# Patient Record
Sex: Female | Born: 1980 | Race: White | Hispanic: No | Marital: Married | Smoking: Current every day smoker
Health system: Southern US, Community
[De-identification: ages and names within clinical notes are randomized; demographics above are authoritative.]

## PROBLEM LIST (undated history)

## (undated) DIAGNOSIS — E282 Polycystic ovarian syndrome: Secondary | ICD-10-CM

## (undated) DIAGNOSIS — N281 Cyst of kidney, acquired: Secondary | ICD-10-CM

## (undated) DIAGNOSIS — E119 Type 2 diabetes mellitus without complications: Secondary | ICD-10-CM

## (undated) HISTORY — PX: CARPAL TUNNEL RELEASE: SHX101

---

## 2014-12-04 ENCOUNTER — Other Ambulatory Visit: Payer: Self-pay

## 2014-12-04 ENCOUNTER — Encounter: Payer: Self-pay | Admitting: *Deleted

## 2014-12-04 ENCOUNTER — Emergency Department
Admission: EM | Admit: 2014-12-04 | Discharge: 2014-12-05 | Disposition: A | Discharge status: Home / Self Care | Payer: TRICARE For Life (TFL) | Attending: Emergency Medicine | Admitting: Emergency Medicine

## 2014-12-04 DIAGNOSIS — R1012 Left upper quadrant pain: Secondary | ICD-10-CM

## 2014-12-04 DIAGNOSIS — E860 Dehydration: Secondary | ICD-10-CM | POA: Insufficient documentation

## 2014-12-04 DIAGNOSIS — R197 Diarrhea, unspecified: Secondary | ICD-10-CM | POA: Insufficient documentation

## 2014-12-04 DIAGNOSIS — Z3202 Encounter for pregnancy test, result negative: Secondary | ICD-10-CM | POA: Insufficient documentation

## 2014-12-04 DIAGNOSIS — Z79899 Other long term (current) drug therapy: Secondary | ICD-10-CM | POA: Diagnosis not present

## 2014-12-04 DIAGNOSIS — E119 Type 2 diabetes mellitus without complications: Secondary | ICD-10-CM | POA: Insufficient documentation

## 2014-12-04 DIAGNOSIS — Z720 Tobacco use: Secondary | ICD-10-CM | POA: Insufficient documentation

## 2014-12-04 DIAGNOSIS — R112 Nausea with vomiting, unspecified: Secondary | ICD-10-CM | POA: Insufficient documentation

## 2014-12-04 DIAGNOSIS — R55 Syncope and collapse: Secondary | ICD-10-CM | POA: Insufficient documentation

## 2014-12-04 HISTORY — DX: Polycystic ovarian syndrome: E28.2

## 2014-12-04 HISTORY — DX: Type 2 diabetes mellitus without complications: E11.9

## 2014-12-04 HISTORY — DX: Cyst of kidney, acquired: N28.1

## 2014-12-04 LAB — CBC WITH DIFFERENTIAL/PLATELET
BASOS ABS: 0.1 10*3/uL (ref 0–0.1)
Basophils Relative: 1 %
Eosinophils Absolute: 0.4 10*3/uL (ref 0–0.7)
Eosinophils Relative: 2 %
HEMATOCRIT: 54.7 % — AB (ref 35.0–47.0)
Hemoglobin: 18.3 g/dL — ABNORMAL HIGH (ref 12.0–16.0)
Lymphocytes Relative: 7 %
Lymphs Abs: 1.3 10*3/uL (ref 1.0–3.6)
MCH: 30.5 pg (ref 26.0–34.0)
MCHC: 33.5 g/dL (ref 32.0–36.0)
MCV: 91 fL (ref 80.0–100.0)
Monocytes Absolute: 1.4 10*3/uL — ABNORMAL HIGH (ref 0.2–0.9)
Monocytes Relative: 8 %
Neutro Abs: 15.6 10*3/uL — ABNORMAL HIGH (ref 1.4–6.5)
Neutrophils Relative %: 82 %
Platelets: 240 10*3/uL (ref 150–440)
RBC: 6.01 MIL/uL — ABNORMAL HIGH (ref 3.80–5.20)
RDW: 13.4 % (ref 11.5–14.5)
WBC: 18.8 10*3/uL — ABNORMAL HIGH (ref 3.6–11.0)

## 2014-12-04 LAB — COMPREHENSIVE METABOLIC PANEL
ALBUMIN: 4.4 g/dL (ref 3.5–5.0)
ALT: 36 U/L (ref 14–54)
AST: 28 U/L (ref 15–41)
Alkaline Phosphatase: 66 U/L (ref 38–126)
Anion gap: 9 (ref 5–15)
BUN: 19 mg/dL (ref 6–20)
CHLORIDE: 103 mmol/L (ref 101–111)
CO2: 29 mmol/L (ref 22–32)
CREATININE: 0.94 mg/dL (ref 0.44–1.00)
Calcium: 9.3 mg/dL (ref 8.9–10.3)
GFR calc Af Amer: >60 mL/min (ref >60)
GFR calc non Af Amer: >60 mL/min (ref >60)
Glucose, Bld: 172 mg/dL — ABNORMAL HIGH (ref 65–99)
Potassium: 4.9 mmol/L (ref 3.5–5.1)
Sodium: 141 mmol/L (ref 135–145)
TOTAL PROTEIN: 7.8 g/dL (ref 6.5–8.1)
Total Bilirubin: 1 mg/dL (ref 0.3–1.2)

## 2014-12-04 LAB — LIPASE, BLOOD: Lipase: 49 U/L (ref 22–51)

## 2014-12-04 NOTE — ED Notes (Signed)
Pt to triage via wheelchair.  Pt brought in via ems from home.  Pt has abd pain with nausea and vomiting.  Pt has lower back pain.  Denies urinary sx.

## 2014-12-04 NOTE — ED Notes (Signed)
Pt unable to void at this time. 

## 2014-12-05 ENCOUNTER — Encounter: Payer: Self-pay | Admitting: Emergency Medicine

## 2014-12-05 LAB — URINALYSIS COMPLETE WITH MICROSCOPIC (ARMC ONLY)
Bilirubin Urine: NEGATIVE
GLUCOSE, UA: NEGATIVE mg/dL
HGB URINE DIPSTICK: NEGATIVE
KETONES UR: NEGATIVE mg/dL
LEUKOCYTES UA: NEGATIVE
Nitrite: NEGATIVE
Protein, ur: NEGATIVE mg/dL
SPECIFIC GRAVITY, URINE: 1.029 (ref 1.005–1.030)
pH: 5 (ref 5.0–8.0)

## 2014-12-05 LAB — PREGNANCY, URINE: PREG TEST UR: NEGATIVE

## 2014-12-05 LAB — TROPONIN I: Troponin I: <0.03 ng/mL (ref <0.031)

## 2014-12-05 MED ORDER — GI COCKTAIL ~~LOC~~
30.0000 mL | Freq: Once | ORAL | Status: AC
  Administered 2014-12-05: 30 mL via ORAL
  Filled 2014-12-05: qty 30

## 2014-12-05 MED ORDER — METOCLOPRAMIDE HCL 5 MG/ML IJ SOLN
10.0000 mg | Freq: Once | INTRAMUSCULAR | Status: AC
  Administered 2014-12-05: 10 mg via INTRAVENOUS
  Filled 2014-12-05: qty 2

## 2014-12-05 MED ORDER — METOCLOPRAMIDE HCL 10 MG PO TABS: 10.0000 mg | ORAL_TABLET | Freq: Three times a day (TID) | ORAL | Status: AC

## 2014-12-05 MED ORDER — SODIUM CHLORIDE 0.9 % IV BOLUS (SEPSIS)
1000.0000 mL | Freq: Once | INTRAVENOUS | Status: AC
  Administered 2014-12-05: 1000 mL via INTRAVENOUS

## 2014-12-05 NOTE — Discharge Instructions (Signed)
Dehydration, Adult Dehydration is when you lose more fluids from the body than you take in. Vital organs like the kidneys, brain, and heart cannot function without a proper amount of fluids and salt. Any loss of fluids from the body can cause dehydration.  CAUSES   Vomiting.  Diarrhea.  Excessive sweating.  Excessive urine output.  Fever. SYMPTOMS  Mild dehydration  Thirst.  Dry lips.  Slightly dry mouth. Moderate dehydration  Very dry mouth.  Sunken eyes.  Skin does not bounce back quickly when lightly pinched and released.  Dark urine and decreased urine production.  Decreased tear production.  Headache. Severe dehydration  Very dry mouth.  Extreme thirst.  Rapid, weak pulse (more than 100 beats per minute at rest).  Cold hands and feet.  Not able to sweat in spite of heat and temperature.  Rapid breathing.  Blue lips.  Confusion and lethargy.  Difficulty being awakened.  Minimal urine production.  No tears. DIAGNOSIS  Your caregiver will diagnose dehydration based on your symptoms and your exam. Blood and urine tests will help confirm the diagnosis. The diagnostic evaluation should also identify the cause of dehydration. TREATMENT  Treatment of mild or moderate dehydration can often be done at home by increasing the amount of fluids that you drink. It is best to drink small amounts of fluid more often. Drinking too much at one time can make vomiting worse. Refer to the home care instructions below. Severe dehydration needs to be treated at the hospital where you will probably be given intravenous (IV) fluids that contain water and electrolytes. HOME CARE INSTRUCTIONS   Ask your caregiver about specific rehydration instructions.  Drink enough fluids to keep your urine clear or pale yellow.  Drink small amounts frequently if you have nausea and vomiting.  Eat as you normally do.  Avoid:  Foods or drinks high in sugar.  Carbonated  drinks.  Juice.  Extremely hot or cold fluids.  Drinks with caffeine.  Fatty, greasy foods.  Alcohol.  Tobacco.  Overeating.  Gelatin desserts.  Wash your hands well to avoid spreading bacteria and viruses.  Only take over-the-counter or prescription medicines for pain, discomfort, or fever as directed by your caregiver.  Ask your caregiver if you should continue all prescribed and over-the-counter medicines.  Keep all follow-up appointments with your caregiver. SEEK MEDICAL CARE IF:  You have abdominal pain and it increases or stays in one area (localizes).  You have a rash, stiff neck, or severe headache.  You are irritable, sleepy, or difficult to awaken.  You are weak, dizzy, or extremely thirsty. SEEK IMMEDIATE MEDICAL CARE IF:   You are unable to keep fluids down or you get worse despite treatment.  You have frequent episodes of vomiting or diarrhea.  You have blood or green matter (bile) in your vomit.  You have blood in your stool or your stool looks black and tarry.  You have not urinated in 6 to 8 hours, or you have only urinated a small amount of very dark urine.  You have a fever.  You faint. MAKE SURE YOU:   Understand these instructions.  Will watch your condition.  Will get help right away if you are not doing well or get worse. Document Released: 05/11/2005 Document Revised: 08/03/2011 Document Reviewed: 12/29/2010 [facility] Patient Information 2015 [place], [place]. This information is not intended to replace advice given to you by your health care provider. Make sure you discuss any questions you have with your health care  provider.  Nausea and Vomiting Nausea is a sick feeling that often comes before throwing up (vomiting). Vomiting is a reflex where stomach contents come out of your mouth. Vomiting can cause severe loss of body fluids (dehydration). Children and elderly adults can become dehydrated quickly, especially if they also have  diarrhea. Nausea and vomiting are symptoms of a condition or disease. It is important to find the cause of your symptoms. CAUSES   Direct irritation of the stomach lining. This irritation can result from increased acid production (gastroesophageal reflux disease), infection, food poisoning, taking certain medicines (such as nonsteroidal anti-inflammatory drugs), alcohol use, or tobacco use.  Signals from the brain.These signals could be caused by a headache, heat exposure, an inner ear disturbance, increased pressure in the brain from injury, infection, a tumor, or a concussion, pain, emotional stimulus, or metabolic problems.  An obstruction in the gastrointestinal tract (bowel obstruction).  Illnesses such as diabetes, hepatitis, gallbladder problems, appendicitis, kidney problems, cancer, sepsis, atypical symptoms of a heart attack, or eating disorders.  Medical treatments such as chemotherapy and radiation.  Receiving medicine that makes you sleep (general anesthetic) during surgery. DIAGNOSIS Your caregiver may ask for tests to be done if the problems do not improve after a few days. Tests may also be done if symptoms are severe or if the reason for the nausea and vomiting is not clear. Tests may include:  Urine tests.  Blood tests.  Stool tests.  Cultures (to look for evidence of infection).  X-rays or other imaging studies. Test results can help your caregiver make decisions about treatment or the need for additional tests. TREATMENT You need to stay well hydrated. Drink frequently but in small amounts.You may wish to drink water, sports drinks, clear broth, or eat frozen ice pops or gelatin dessert to help stay hydrated.When you eat, eating slowly may help prevent nausea.There are also some antinausea medicines that may help prevent nausea. HOME CARE INSTRUCTIONS   Take all medicine as directed by your caregiver.  If you do not have an appetite, do not force yourself to  eat. However, you must continue to drink fluids.  If you have an appetite, eat a normal diet unless your caregiver tells you differently.  Eat a variety of complex carbohydrates (rice, wheat, potatoes, bread), lean meats, yogurt, fruits, and vegetables.  Avoid high-fat foods because they are more difficult to digest.  Drink enough water and fluids to keep your urine clear or pale yellow.  If you are dehydrated, ask your caregiver for specific rehydration instructions. Signs of dehydration may include:  Severe thirst.  Dry lips and mouth.  Dizziness.  Dark urine.  Decreasing urine frequency and amount.  Confusion.  Rapid breathing or pulse. SEEK IMMEDIATE MEDICAL CARE IF:   You have blood or brown flecks (like coffee grounds) in your vomit.  You have black or bloody stools.  You have a severe headache or stiff neck.  You are confused.  You have severe abdominal pain.  You have chest pain or trouble breathing.  You do not urinate at least once every 8 hours.  You develop cold or clammy skin.  You continue to vomit for longer than 24 to 48 hours.  You have a fever. MAKE SURE YOU:   Understand these instructions.  Will watch your condition.  Will get help right away if you are not doing well or get worse. Document Released: 05/11/2005 Document Revised: 08/03/2011 Document Reviewed: 10/08/2010 [facility] Patient Information 2015 [place], [place]. This information  is not intended to replace advice given to you by your health care provider. Make sure you discuss any questions you have with your health care provider.  Syncope Syncope is a medical term for fainting or passing out. This means you lose consciousness and drop to the ground. People are generally unconscious for less than 5 minutes. You may have some muscle twitches for up to 15 seconds before waking up and returning to normal. Syncope occurs more often in older adults, but it can happen to anyone. While most  causes of syncope are not dangerous, syncope can be a sign of a serious medical problem. It is important to seek medical care.  CAUSES  Syncope is caused by a sudden drop in blood flow to the brain. The specific cause is often not determined. Factors that can bring on syncope include:  Taking medicines that lower blood pressure.  Sudden changes in posture, such as standing up quickly.  Taking more medicine than prescribed.  Standing in one place for too long.  Seizure disorders.  Dehydration and excessive exposure to heat.  Low blood sugar (hypoglycemia).  Straining to have a bowel movement.  Heart disease, irregular heartbeat, or other circulatory problems.  Fear, emotional distress, seeing blood, or severe pain. SYMPTOMS  Right before fainting, you may:  Feel dizzy or light-headed.  Feel nauseous.  See all white or all black in your field of vision.  Have cold, clammy skin. DIAGNOSIS  Your health care provider will ask about your symptoms, perform a physical exam, and perform an electrocardiogram (ECG) to record the electrical activity of your heart. Your health care provider may also perform other heart or blood tests to determine the cause of your syncope which may include:  Transthoracic echocardiogram (TTE). During echocardiography, sound waves are used to evaluate how blood flows through your heart.  Transesophageal echocardiogram (TEE).  Cardiac monitoring. This allows your health care provider to monitor your heart rate and rhythm in real time.  Holter monitor. This is a portable device that records your heartbeat and can help diagnose heart arrhythmias. It allows your health care provider to track your heart activity for several days, if needed.  Stress tests by exercise or by giving medicine that makes the heart beat faster. TREATMENT  In most cases, no treatment is needed. Depending on the cause of your syncope, your health care provider may recommend  changing or stopping some of your medicines. HOME CARE INSTRUCTIONS  Have someone stay with you until you feel stable.  Do not drive, use machinery, or play sports until your health care provider says it is okay.  Keep all follow-up appointments as directed by your health care provider.  Lie down right away if you start feeling like you might faint. Breathe deeply and steadily. Wait until all the symptoms have passed.  Drink enough fluids to keep your urine clear or pale yellow.  If you are taking blood pressure or heart medicine, get up slowly and take several minutes to sit and then stand. This can reduce dizziness. SEEK IMMEDIATE MEDICAL CARE IF:   You have a severe headache.  You have unusual pain in the chest, abdomen, or back.  You are bleeding from your mouth or rectum, or you have black or tarry stool.  You have an irregular or very fast heartbeat.  You have pain with breathing.  You have repeated fainting or seizure-like jerking during an episode.  You faint when sitting or lying down.  You have confusion.  You have trouble walking.  You have severe weakness.  You have vision problems. If you fainted, call your local emergency services (911 in [place] Do not drive yourself to the hospital.  MAKE SURE YOU:  Understand these instructions.  Will watch your condition.  Will get help right away if you are not doing well or get worse. Document Released: 05/11/2005 Document Revised: 05/16/2013 Document Reviewed: 07/10/2011 [facility] Patient Information 2015 [place]. This information is not intended to replace advice given to you by your health care provider. Make sure you discuss any questions you have with your health care provider.

## 2014-12-05 NOTE — ED Provider Notes (Signed)
[facility] Emergency Department Provider Note  ____________________________________________  Time seen: Approximately 2354 AM  I have reviewed the triage vital signs and the nursing notes.   HISTORY  Chief Complaint Abdominal Pain    HPI Melinda Patrick is a 34 y.o. female who comes in with nausea vomiting and abdominal pain. The patient reports that her nausea started at approximate 4 PM. The patient reports that she took some Zofran which she had had from a previous ER visit and reports that it was not working. The patient started vomiting and reports that she vomited profusely for some time. She reports initially it was food and then turned into yellow appearing emesis. She reports that she was walking to the bed and had a syncopal episode. According to the patient's husband she passed out onto the bed and was only out for a couple of minutes. She reports that she had a similar episode with some nausea and diarrhea as well as abdominal pain and was diagnosed with inflammation in her colon. The patient reports that today she feels bloated and discomfort which is 8 out of 10 in intensity. She reports that she has not had any diarrhea today but did have some yesterday. The patient is concerned about Crohn's disease and wants a GI referral. The patient is here because she still feels nauseous and is unable to stop the nausea and vomiting.   Past Medical History  Diagnosis Date  . Diabetes mellitus without complication   . Polycystic ovarian syndrome   . Renal cyst     There are no active problems to display for this patient.   Past Surgical History  Procedure Laterality Date  . Carpal tunnel release Bilateral     Current Outpatient Rx  Name  Route  Sig  Dispense  Refill  . metoCLOPramide (REGLAN) 10 MG tablet   Oral   Take 1 tablet (10 mg total) by mouth 3 (three) times daily with meals.   21 tablet   0     Allergies Prozac  History reviewed. No  pertinent family history.  Social History History  Substance Use Topics  . Smoking status: Current Every Day Smoker  . Smokeless tobacco: Never Used  . Alcohol Use: No    Review of Systems Constitutional: No fever/chills Eyes: No visual changes. ENT: No sore throat. Cardiovascular: Denies chest pain. Respiratory: Denies shortness of breath. Gastrointestinal: Abdominal pain, nausea, vomiting, diarrhea Genitourinary: Negative for dysuria. Musculoskeletal: back pain. Skin: Negative for rash. Neurological: Syncope  10-point ROS otherwise negative.  ____________________________________________   PHYSICAL EXAM:  VITAL SIGNS: ED Triage Vitals  Enc Vitals Group     BP 12/04/14 2059 109/70 mmHg     Pulse Rate 12/04/14 2059 95     Resp 12/04/14 2059 22     Temp 12/04/14 2059 98.4 F (36.9 C)     Temp Source 12/04/14 2059 Oral     SpO2 12/04/14 2059 98 %     Weight 12/04/14 2059 290 lb (131.543 kg)     Height 12/04/14 2059  (1.651 m)     Head Cir --      Peak Flow --      Pain Score 12/04/14 2101 8     Pain Loc --      Pain Edu? --      Excl. in GC? --     Constitutional: Alert and oriented. Well appearing and in moderate distress. Eyes: Conjunctivae are normal. PERRL. EOMI. Head: Atraumatic. Nose:  No congestion/rhinnorhea. Mouth/Throat: Mucous membranes are moist.  Oropharynx non-erythematous. Cardiovascular: Normal rate, regular rhythm. Grossly normal heart sounds.  Good peripheral circulation. Respiratory: Normal respiratory effort.  No retractions. Lungs CTAB. Gastrointestinal: Soft with left upper quadrant tenderness to palpation. No distention. Positive bowel sounds Genitourinary: Deferred Musculoskeletal: No lower extremity tenderness nor edema.   Neurologic:  Normal speech and language. No gross focal neurologic deficits are appreciated.  Skin:  Skin is warm, dry and intact. No rash noted. Psychiatric: Mood and affect are normal.    ____________________________________________   LABS (all labs ordered are listed, but only abnormal results are displayed)  Labs Reviewed  CBC WITH DIFFERENTIAL/PLATELET - Abnormal; Notable for the following:    WBC 18.8 (*)    RBC 6.01 (*)    Hemoglobin 18.3 (*)    HCT 54.7 (*)    Neutro Abs 15.6 (*)    Monocytes Absolute 1.4 (*)    All other components within normal limits  COMPREHENSIVE METABOLIC PANEL - Abnormal; Notable for the following:    Glucose, Bld 172 (*)    All other components within normal limits  URINALYSIS COMPLETEWITH MICROSCOPIC ([facility] ONLY) - Abnormal; Notable for the following:    Color, Urine AMBER (*)    APPearance CLEAR (*)    Bacteria, UA RARE (*)    Squamous Epithelial / LPF 6-30 (*)    All other components within normal limits  LIPASE, BLOOD  TROPONIN I  PREGNANCY, URINE  POC URINE PREG, ED   ____________________________________________  EKG  ED ECG REPORT I, Rebecka ApleyWebster,  Allison P, the attending physician, personally viewed and interpreted this ECG.   Date: 12/05/2014  EKG Time: 2318  Rate: 98  Rhythm: normal sinus rhythm  Axis: normal  Intervals:none  ST&T Change: none  ____________________________________________  RADIOLOGY  None ____________________________________________   PROCEDURES  Procedure(s) performed: None  Critical Care performed: No  ____________________________________________   INITIAL IMPRESSION / ASSESSMENT AND PLAN / ED COURSE  Pertinent labs & imaging results that were available during my care of the patient were reviewed by me and considered in my medical decision making (see chart for details).  This is a 34 year old female who comes in today with abdominal discomfort vomiting and having a syncopal episode after multiple episodes of vomiting. The patient does have an elevated white blood cell count but she also has an elevated H&H with a concern for dehydration. I will give the patient 2 L of normal  saline to orthostatic vital signs and assess her with a troponin. The patient received some Reglan as well for nausea. The patient reports that she did have a CT scan 1 month ago and was diagnosed with colitis and given antibiotics. I will reassess the patient and her discomfort when she's received her fluids and her medication and determine if she needs any further imaging at that time.  ----------------------------------------- 4:16 AM on 12/05/2014 -----------------------------------------  The patient received 2 L of normal saline as well as some Zofran. She reports that she did feel improved after the fluid. The patient was able to drink some ice water and eat some graham crackers without any further emesis. Orthostatic vital signs did show the patient was orthostatic with a heart rate that went from 93 laying 214 standing. The patient reports that she does feel much better and she does not have any more abdominal pain. The patient did have an elevated white blood cell count but feel that that may have been due to hemoconcentration as her hemoglobin  and hematocrit were also elevated. I will discharge the patient home and have her follow-up with GI. I will not do a CT scan as the patient received one month ago and has not want to over expose her to radiation. I discussed with the patient and she agrees with the plan as stated. ____________________________________________   FINAL CLINICAL IMPRESSION(S) / ED DIAGNOSES  Final diagnoses:  Dehydration  Syncope, unspecified syncope type  Non-intractable vomiting with nausea, vomiting of unspecified type  Left upper quadrant pain      Rebecka Apley, MD 12/05/14 [phone]

## 2015-03-12 ENCOUNTER — Emergency Department
Admission: EM | Admit: 2015-03-12 | Discharge: 2015-03-13 | Disposition: A | Discharge status: Home / Self Care | Payer: TRICARE For Life (TFL) | Attending: Emergency Medicine | Admitting: Emergency Medicine

## 2015-03-12 DIAGNOSIS — R112 Nausea with vomiting, unspecified: Secondary | ICD-10-CM | POA: Insufficient documentation

## 2015-03-12 DIAGNOSIS — J40 Bronchitis, not specified as acute or chronic: Secondary | ICD-10-CM

## 2015-03-12 DIAGNOSIS — Z720 Tobacco use: Secondary | ICD-10-CM | POA: Diagnosis not present

## 2015-03-12 DIAGNOSIS — R05 Cough: Secondary | ICD-10-CM | POA: Diagnosis present

## 2015-03-12 DIAGNOSIS — R197 Diarrhea, unspecified: Secondary | ICD-10-CM | POA: Insufficient documentation

## 2015-03-12 DIAGNOSIS — J45901 Unspecified asthma with (acute) exacerbation: Secondary | ICD-10-CM | POA: Diagnosis not present

## 2015-03-12 DIAGNOSIS — E119 Type 2 diabetes mellitus without complications: Secondary | ICD-10-CM | POA: Insufficient documentation

## 2015-03-12 DIAGNOSIS — Z79899 Other long term (current) drug therapy: Secondary | ICD-10-CM | POA: Insufficient documentation

## 2015-03-12 DIAGNOSIS — R1013 Epigastric pain: Secondary | ICD-10-CM | POA: Diagnosis not present

## 2015-03-12 DIAGNOSIS — J4530 Mild persistent asthma, uncomplicated: Secondary | ICD-10-CM

## 2015-03-12 LAB — COMPREHENSIVE METABOLIC PANEL
ALT: 24 U/L (ref 14–54)
AST: 22 U/L (ref 15–41)
Albumin: 4.5 g/dL (ref 3.5–5.0)
Alkaline Phosphatase: 81 U/L (ref 38–126)
Anion gap: 10 (ref 5–15)
BUN: 10 mg/dL (ref 6–20)
CO2: 25 mmol/L (ref 22–32)
Calcium: 9.2 mg/dL (ref 8.9–10.3)
Chloride: 103 mmol/L (ref 101–111)
Creatinine, Ser: 0.8 mg/dL (ref 0.44–1.00)
GLUCOSE: 198 mg/dL — AB (ref 65–99)
POTASSIUM: 4 mmol/L (ref 3.5–5.1)
Sodium: 138 mmol/L (ref 135–145)
TOTAL PROTEIN: 8.3 g/dL — AB (ref 6.5–8.1)
Total Bilirubin: 1.1 mg/dL (ref 0.3–1.2)

## 2015-03-12 LAB — CBC
HEMATOCRIT: 58.3 % — AB (ref 35.0–47.0)
Hemoglobin: 19.8 g/dL — ABNORMAL HIGH (ref 12.0–16.0)
MCH: 30.5 pg (ref 26.0–34.0)
MCHC: 33.9 g/dL (ref 32.0–36.0)
MCV: 90 fL (ref 80.0–100.0)
PLATELETS: 259 10*3/uL (ref 150–440)
RBC: 6.48 MIL/uL — ABNORMAL HIGH (ref 3.80–5.20)
RDW: 13.1 % (ref 11.5–14.5)
WBC: 18.6 10*3/uL — ABNORMAL HIGH (ref 3.6–11.0)

## 2015-03-12 LAB — LIPASE, BLOOD: LIPASE: 22 U/L (ref 22–51)

## 2015-03-12 MED ORDER — SODIUM CHLORIDE 0.9 % IV BOLUS (SEPSIS)
1000.0000 mL | Freq: Once | INTRAVENOUS | Status: AC
  Administered 2015-03-12: 1000 mL via INTRAVENOUS

## 2015-03-12 MED ORDER — ONDANSETRON HCL 4 MG/2ML IJ SOLN
4.0000 mg | Freq: Once | INTRAMUSCULAR | Status: AC
  Administered 2015-03-12: 4 mg via INTRAVENOUS
  Filled 2015-03-12: qty 2

## 2015-03-12 NOTE — ED Notes (Addendum)
Pt bib EMS w/ c/o n/v/d and ab pain.  Per EMS, pts S/S began this evening.  Pt is also having SOB and weakness.  Pt able to ambulate to BR. Pt having active diarrhea. Per EMS, pt was 83% on RA and received duoneb tx en route.  Pt 90% on 5 L.

## 2015-03-13 ENCOUNTER — Emergency Department: Payer: TRICARE For Life (TFL)

## 2015-03-13 LAB — TROPONIN I: Troponin I: <0.03 ng/mL (ref <0.031)

## 2015-03-13 MED ORDER — ALBUTEROL SULFATE HFA 108 (90 BASE) MCG/ACT IN AERS: 2.0000 | INHALATION_SPRAY | Freq: Four times a day (QID) | RESPIRATORY_TRACT | Status: AC | PRN

## 2015-03-13 MED ORDER — AZITHROMYCIN 250 MG PO TABS
500.0000 mg | ORAL_TABLET | Freq: Once | ORAL | Status: AC
  Administered 2015-03-13: 500 mg via ORAL
  Filled 2015-03-13: qty 2

## 2015-03-13 MED ORDER — IPRATROPIUM-ALBUTEROL 0.5-2.5 (3) MG/3ML IN SOLN
3.0000 mL | Freq: Once | RESPIRATORY_TRACT | Status: AC
  Administered 2015-03-13: 3 mL via RESPIRATORY_TRACT
  Filled 2015-03-13: qty 3

## 2015-03-13 MED ORDER — AZITHROMYCIN 250 MG PO TABS: ORAL_TABLET | ORAL | Status: AC

## 2015-03-13 MED ORDER — METHYLPREDNISOLONE SODIUM SUCC 125 MG IJ SOLR
125.0000 mg | Freq: Once | INTRAMUSCULAR | Status: AC
  Administered 2015-03-13: 125 mg via INTRAVENOUS
  Filled 2015-03-13: qty 2

## 2015-03-13 MED ORDER — PREDNISONE 20 MG PO TABS: 40.0000 mg | ORAL_TABLET | Freq: Every day | ORAL | Status: AC

## 2015-03-13 NOTE — Discharge Instructions (Signed)
Please take your medications as prescribed. Please follow-up your primary care physician in 2-3 days for recheck. Return to the emergency department for any trouble breathing, vomiting unable to keep down her medications, chest pain, or any other symptom personally concerning to yourself.

## 2015-03-13 NOTE — ED Provider Notes (Addendum)
[facility] Emergency Department Provider Note  Time seen: 12:18 AM  I have reviewed the triage vital signs and the nursing notes.   HISTORY  Chief Complaint Abdominal Pain    HPI Melinda Patrick is a 34 y.o. female with a past medical history of diabetes, presents the emergency department with nausea, vomiting, diarrhea, shortness of breath and cough. According to the patient she has had diarrhea intermittently for the past several days. Today she began feeling very nauseated and vomiting. Patient also states shortness of breath with a cough, which she states it developed after vomiting. Patient denies any history of COPD, but does admit smoking one pack of cigarettes per day. Denies any fever. Denies any sputum production. Denies abdominal pain, she states she had some mild epigastric pain earlier but it has resolved. Denies chest pain. Describes her shortness of breath is moderate.     Past Medical History  Diagnosis Date  . Diabetes mellitus without complication (HCC)   . Polycystic ovarian syndrome   . Renal cyst     There are no active problems to display for this patient.   Past Surgical History  Procedure Laterality Date  . Carpal tunnel release Bilateral     Current Outpatient Rx  Name  Route  Sig  Dispense  Refill  . metoCLOPramide (REGLAN) 10 MG tablet   Oral   Take 1 tablet (10 mg total) by mouth 3 (three) times daily with meals.   21 tablet   0     Allergies Prozac  No family history on file.  Social History Social History  Substance Use Topics  . Smoking status: Current Every Day Smoker  . Smokeless tobacco: Never Used  . Alcohol Use: No    Review of Systems Constitutional: Negative for fever. Cardiovascular: Negative for chest pain. Respiratory: Positive for shortness of breath and cough. Gastrointestinal: Mild epigastric pain, resolved. Positive for nausea and vomiting and diarrhea. Genitourinary: Negative for  dysuria. Musculoskeletal: Negative for back pain Neurological: Negative for headache 10-point ROS otherwise negative.  ____________________________________________   PHYSICAL EXAM:  VITAL SIGNS: ED Triage Vitals  Enc Vitals Group     BP 03/12/15 2311 105/66 mmHg     Pulse Rate 03/12/15 2305 91     Resp 03/12/15 2305 24     Temp 03/12/15 2305 98.2 F (36.8 C)     Temp Source 03/12/15 2305 Oral     SpO2 03/12/15 2305 88 %     Weight --      Height --      Head Cir --      Peak Flow --      Pain Score 03/12/15 2305 8     Pain Loc --      Pain Edu? --      Excl. in GC? --     Constitutional: Alert and oriented. Well appearing and in no distress. Eyes: Normal exam ENT   Head: Normocephalic and atraumatic.   Mouth/Throat: Mucous membranes are moist. Cardiovascular: Normal rate, regular rhythm.  Respiratory: Mild tachypnea, decreased breath sounds bilaterally, with moderate expiratory wheeze bilaterally. Mild rhonchi bilaterally which largely clears after coughing. Gastrointestinal: Soft and nontender. No distention. No rebound or guarding. Musculoskeletal: Nontender with normal range of motion in all extremities.  Neurologic:  Normal speech and language. No gross focal neurologic deficits Psychiatric: Mood and affect are normal. Speech and behavior are normal.  ____________________________________________     RADIOLOGY  Chest x-ray shows possible mild interstitial edema.  ____________________________________________    INITIAL IMPRESSION / ASSESSMENT AND PLAN / ED COURSE  Pertinent labs & imaging results that were available during my care of the patient were reviewed by me and considered in my medical decision making (see chart for details).  Patient presented with nausea, vomiting, diarrhea, dyspnea and cough. On arrival the patient has an 83% oxygen saturation on room air, with no home O2 requirement. Patient has diffuse wheeze, with decreased breath sounds  bilaterally. Moderate rhonchi with largely clear with coughing. We will check labs, does do an absent steroids, obtain a chest x-ray and closely monitor in the emergency department. Patient maintains oxygen saturations in the low 90s on 5 L nasal cannula. At a minimum a believe the patient is experiencing reactive airway disease versus undiagnosed COPD exacerbation. We'll obtain a chest x-ray to rule out pneumonia/aspiration.  X-ray largely within normal limits, possible mild interstitial edema. Patient now satting 94% on room air. Denies any abdominal discomfort. Denies any chest pain. States she feels considerably better. Her exam is most consistent with COPD/reactive airway disease. We will cover with antibiotics, 5 days of steroids, and discharged with albuterol MDI prescription. I discussed very strict return precautions with the patient for any worsening, fever, or trouble breathing. Patient is agreeable, otherwise up up with her primary care physician this week. Patient's elevated white blood cell count, and hemoglobin appears largely at her baseline.  ____________________________________________   FINAL CLINICAL IMPRESSION(S) / ED DIAGNOSES  Acute bronchitis Reactive airway disease   Minna AntisKevin Priyansh Pry, MD 03/13/15 [id number]  Minna AntisKevin Ronny Korff, MD 03/13/15 [id number]

## 2015-03-29 ENCOUNTER — Emergency Department
Admission: EM | Admit: 2015-03-29 | Discharge: 2015-03-29 | Disposition: A | Discharge status: Home / Self Care | Payer: TRICARE For Life (TFL) | Attending: Emergency Medicine | Admitting: Emergency Medicine

## 2015-03-29 DIAGNOSIS — Z79899 Other long term (current) drug therapy: Secondary | ICD-10-CM | POA: Diagnosis not present

## 2015-03-29 DIAGNOSIS — K2970 Gastritis, unspecified, without bleeding: Secondary | ICD-10-CM | POA: Insufficient documentation

## 2015-03-29 DIAGNOSIS — F419 Anxiety disorder, unspecified: Secondary | ICD-10-CM | POA: Insufficient documentation

## 2015-03-29 DIAGNOSIS — R112 Nausea with vomiting, unspecified: Secondary | ICD-10-CM | POA: Diagnosis present

## 2015-03-29 DIAGNOSIS — Z720 Tobacco use: Secondary | ICD-10-CM | POA: Insufficient documentation

## 2015-03-29 DIAGNOSIS — E119 Type 2 diabetes mellitus without complications: Secondary | ICD-10-CM | POA: Diagnosis not present

## 2015-03-29 DIAGNOSIS — R55 Syncope and collapse: Secondary | ICD-10-CM | POA: Diagnosis not present

## 2015-03-29 DIAGNOSIS — Z7952 Long term (current) use of systemic steroids: Secondary | ICD-10-CM | POA: Diagnosis not present

## 2015-03-29 LAB — COMPREHENSIVE METABOLIC PANEL
ALK PHOS: 63 U/L (ref 38–126)
ALT: 19 U/L (ref 14–54)
ANION GAP: 4 — AB (ref 5–15)
AST: 19 U/L (ref 15–41)
Albumin: 4.1 g/dL (ref 3.5–5.0)
BUN: 8 mg/dL (ref 6–20)
CALCIUM: 9.1 mg/dL (ref 8.9–10.3)
CHLORIDE: 107 mmol/L (ref 101–111)
CO2: 25 mmol/L (ref 22–32)
CREATININE: 0.78 mg/dL (ref 0.44–1.00)
Glucose, Bld: 150 mg/dL — ABNORMAL HIGH (ref 65–99)
Potassium: 3.8 mmol/L (ref 3.5–5.1)
Sodium: 136 mmol/L (ref 135–145)
Total Bilirubin: 0.6 mg/dL (ref 0.3–1.2)
Total Protein: 7.3 g/dL (ref 6.5–8.1)

## 2015-03-29 LAB — URINALYSIS COMPLETE WITH MICROSCOPIC (ARMC ONLY)
BILIRUBIN URINE: NEGATIVE
Glucose, UA: NEGATIVE mg/dL
HGB URINE DIPSTICK: NEGATIVE
KETONES UR: NEGATIVE mg/dL
NITRITE: NEGATIVE
PH: 6 (ref 5.0–8.0)
Protein, ur: NEGATIVE mg/dL
SPECIFIC GRAVITY, URINE: 1.005 (ref 1.005–1.030)

## 2015-03-29 LAB — CBC
HCT: 51.3 % — ABNORMAL HIGH (ref 35.0–47.0)
Hemoglobin: 17.4 g/dL — ABNORMAL HIGH (ref 12.0–16.0)
MCH: 30.1 pg (ref 26.0–34.0)
MCHC: 33.9 g/dL (ref 32.0–36.0)
MCV: 88.7 fL (ref 80.0–100.0)
PLATELETS: 195 10*3/uL (ref 150–440)
RBC: 5.79 MIL/uL — AB (ref 3.80–5.20)
RDW: 13.1 % (ref 11.5–14.5)
WBC: 14.1 10*3/uL — AB (ref 3.6–11.0)

## 2015-03-29 LAB — LIPASE, BLOOD: LIPASE: 26 U/L (ref 11–51)

## 2015-03-29 MED ORDER — ONDANSETRON HCL 4 MG/2ML IJ SOLN
4.0000 mg | Freq: Once | INTRAMUSCULAR | Status: AC
  Administered 2015-03-29: 4 mg via INTRAVENOUS

## 2015-03-29 MED ORDER — ONDANSETRON HCL 4 MG/2ML IJ SOLN
INTRAMUSCULAR | Status: AC
  Administered 2015-03-29: 4 mg via INTRAVENOUS
  Filled 2015-03-29: qty 2

## 2015-03-29 MED ORDER — SODIUM CHLORIDE 0.9 % IV BOLUS (SEPSIS)
1000.0000 mL | Freq: Once | INTRAVENOUS | Status: AC
  Administered 2015-03-29: 1000 mL via INTRAVENOUS

## 2015-03-29 MED ORDER — PROMETHAZINE HCL 12.5 MG PO TABS: 12.5000 mg | ORAL_TABLET | Freq: Four times a day (QID) | ORAL | Status: AC | PRN

## 2015-03-29 NOTE — ED Provider Notes (Signed)
[facility] Emergency Department Provider Note  ____________________________________________  Time seen: On arrival  I have reviewed the triage vital signs and the nursing notes.   HISTORY  Chief Complaint Nausea; Emesis; and Loss of Consciousness    HPI Melinda Patrick is a 34 y.o. female who presents with complaints of nausea, belching. She is frustrated because she has been having these symptoms over the last 3 months and they have been becoming more frequent. She reports they start with nausea and then belching and then she typically develops vomiting. If she vomits she usually feels better but sometimes she does not. She has been seen in the emergency department for similar symptoms in the past month. She denies chest pain. No fevers or chills. No dysuria. No diarrhea. Normal bowel movements. Positive flatus     Past Medical History  Diagnosis Date  . Diabetes mellitus without complication (HCC)   . Polycystic ovarian syndrome   . Renal cyst     There are no active problems to display for this patient.   Past Surgical History  Procedure Laterality Date  . Carpal tunnel release Bilateral     Current Outpatient Rx  Name  Route  Sig  Dispense  Refill  . albuterol (PROVENTIL HFA;VENTOLIN HFA) 108 (90 BASE) MCG/ACT inhaler   Inhalation   Inhale 2 puffs into the lungs every 6 (six) hours as needed for wheezing or shortness of breath.   1 Inhaler   2   . metoCLOPramide (REGLAN) 10 MG tablet   Oral   Take 1 tablet (10 mg total) by mouth 3 (three) times daily with meals.   21 tablet   0   . predniSONE (DELTASONE) 20 MG tablet   Oral   Take 2 tablets (40 mg total) by mouth daily.   10 tablet   0   . promethazine (PHENERGAN) 12.5 MG tablet   Oral   Take 1 tablet (12.5 mg total) by mouth every 6 (six) hours as needed for nausea or vomiting.   30 tablet   0     Allergies Prozac  No family history on file.  Social History Social  History  Substance Use Topics  . Smoking status: Current Every Day Smoker  . Smokeless tobacco: Never Used  . Alcohol Use: No    Review of Systems  Constitutional: Negative for fever. Eyes: Negative for visual changes. ENT: Negative for sore throat Cardiovascular: Negative for chest pain. Respiratory: Negative for shortness of breath. Gastrointestinal: Positive for nausea Genitourinary: Negative for dysuria. Musculoskeletal: Negative for back pain. Skin: Negative for rash. Neurological: Negative for headaches or focal weakness Psychiatric: Positive for anxiety    ____________________________________________   PHYSICAL EXAM:  VITAL SIGNS: ED Triage Vitals  Enc Vitals Group     BP 03/29/15 1625 112/65 mmHg     Pulse Rate 03/29/15 1625 98     Resp 03/29/15 1625 16     Temp 03/29/15 1625 98.2 F (36.8 C)     Temp Source 03/29/15 1625 Oral     SpO2 03/29/15 1625 93 %     Weight --      Height 03/29/15 1625 5\' 5"  (1.651 m)     Head Cir --      Peak Flow --      Pain Score 03/29/15 1915 0     Pain Loc --      Pain Edu? --      Excl. in GC? --  Constitutional: Alert and oriented. Well appearing and in no distress. Eyes: Conjunctivae are normal.  ENT   Head: Normocephalic and atraumatic.   Mouth/Throat: Mucous membranes are moist. Cardiovascular: Normal rate, regular rhythm. Normal and symmetric distal pulses are present in all extremities. No murmurs, rubs, or gallops. Respiratory: Normal respiratory effort without tachypnea nor retractions. Breath sounds are clear and equal bilaterally.  Gastrointestinal: Soft and non-tender in all quadrants. No distention. There is no CVA tenderness. Genitourinary: deferred Musculoskeletal: Nontender with normal range of motion in all extremities. No lower extremity tenderness nor edema. Neurologic:  Normal speech and language. No gross focal neurologic deficits are appreciated. Skin:  Skin is warm, dry and intact. No  rash noted. Psychiatric: Mood and affect are normal. Patient exhibits appropriate insight and judgment.  ____________________________________________    LABS (pertinent positives/negatives)  Labs Reviewed  COMPREHENSIVE METABOLIC PANEL - Abnormal; Notable for the following:    Glucose, Bld 150 (*)    Anion gap 4 (*)    All other components within normal limits  CBC - Abnormal; Notable for the following:    WBC 14.1 (*)    RBC 5.79 (*)    Hemoglobin 17.4 (*)    HCT 51.3 (*)    All other components within normal limits  URINALYSIS COMPLETEWITH MICROSCOPIC ([facility] ONLY) - Abnormal; Notable for the following:    Color, Urine YELLOW (*)    APPearance HAZY (*)    Leukocytes, UA 1+ (*)    Bacteria, UA MANY (*)    Squamous Epithelial / LPF 6-30 (*)    All other components within normal limits  LIPASE, BLOOD    ____________________________________________   EKG  None  ____________________________________________    RADIOLOGY I have personally reviewed any xrays that were ordered on this patient: None ____________________________________________   PROCEDURES  Procedure(s) performed: none  Critical Care performed: none  ____________________________________________   INITIAL IMPRESSION / ASSESSMENT AND PLAN / ED COURSE  Pertinent labs & imaging results that were available during my care of the patient were reviewed by me and considered in my medical decision making (see chart for details).  Patient presents with nausea that developed today when she woke up. Her abdominal exam is benign and she is overall well-appearing. She does have mild elevation of her white blood cell count likely related to the nausea. We will treat with fluids and antibiotics and reevaluate. I do not believe any imaging is necessary at this time  After fluids and nausea medication patient reports she has an appetite and is anxious to go home. I discussed return precautions with the patient. She  knows to return if any fevers chills abdominal pain or worsening nausea  ____________________________________________   FINAL CLINICAL IMPRESSION(S) / ED DIAGNOSES  Final diagnoses:  Gastritis     [name], MD 03/29/15 2212

## 2015-03-29 NOTE — ED Notes (Signed)
Pt arrived to ED reporting Nausea and "burning burps". Pt has not vomited yet but reports over the past three months on and off with the same nausea vomiting and syncopal episodes. Pt seen at multiple EDs and MD offices but reports symptoms continue to return. Pt reports feeling the same as the last episode of NVD and syncope and feels as though "I am beginning to get sick again."

## 2015-03-29 NOTE — Discharge Instructions (Signed)

## 2015-05-15 ENCOUNTER — Other Ambulatory Visit: Payer: Self-pay | Admitting: Family Medicine

## 2015-05-15 DIAGNOSIS — R110 Nausea: Secondary | ICD-10-CM

## 2015-06-24 ENCOUNTER — Ambulatory Visit
Admission: RE | Admit: 2015-06-24 | Discharge: 2015-06-24 | Disposition: A | Discharge status: Home / Self Care | Payer: TRICARE For Life (TFL) | Source: Ambulatory Visit | Attending: Family Medicine | Admitting: Family Medicine

## 2015-06-24 DIAGNOSIS — R110 Nausea: Secondary | ICD-10-CM

## 2015-06-24 DIAGNOSIS — R112 Nausea with vomiting, unspecified: Secondary | ICD-10-CM | POA: Insufficient documentation

## 2015-06-24 DIAGNOSIS — R1010 Upper abdominal pain, unspecified: Secondary | ICD-10-CM | POA: Diagnosis present

## 2017-12-08 IMAGING — US US ABDOMEN LIMITED
1 series · 14 of 25 positions shown · non-contrast
Comparison: Ultrasound 11/12/2014 and CT scanning than 11/12/2014

CLINICAL DATA: Nausea, vomiting and postprandial upper abdominal
pain since October 2014

EXAM:
US ABDOMEN LIMITED - RIGHT UPPER QUADRANT

[Series 1: us abdomen limited · 0.22mm/px · 14 of 56 slices shown]
[im 1/56]
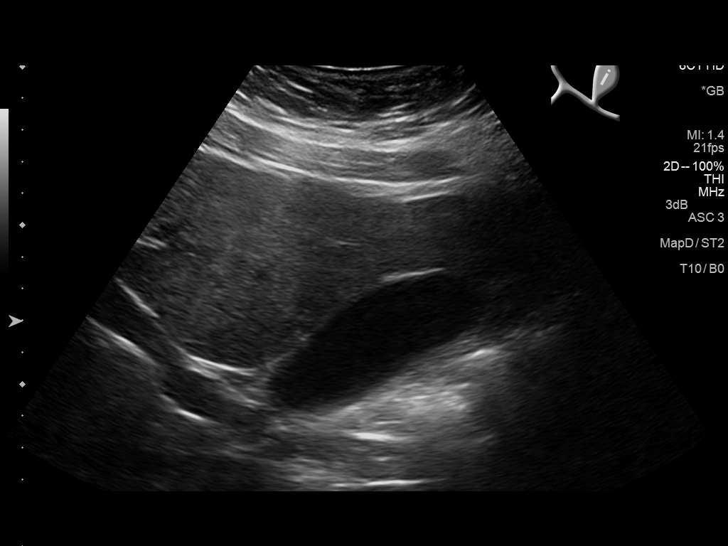
[im 5/56]
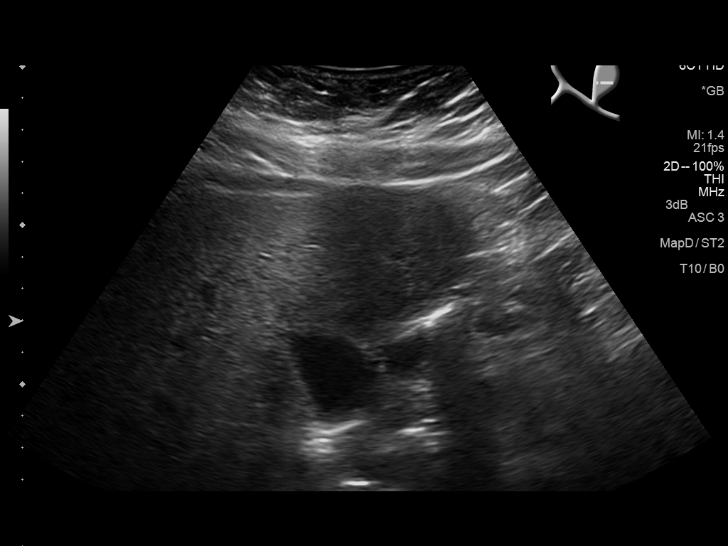
[im 10/56]
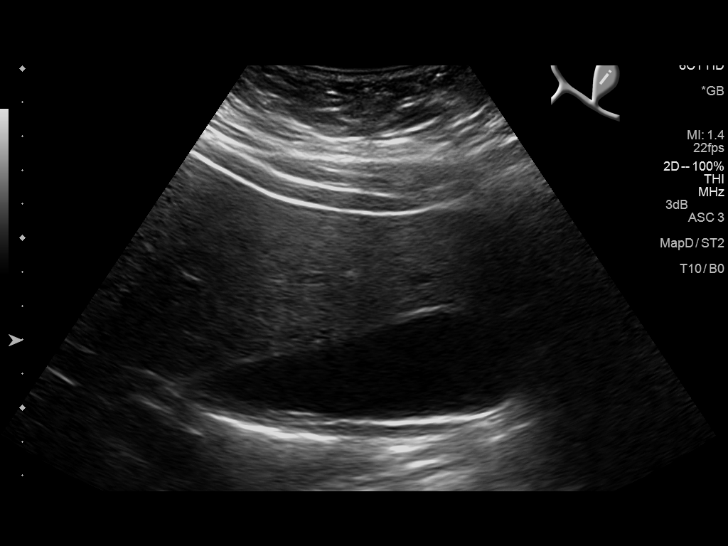
[im 14/56]
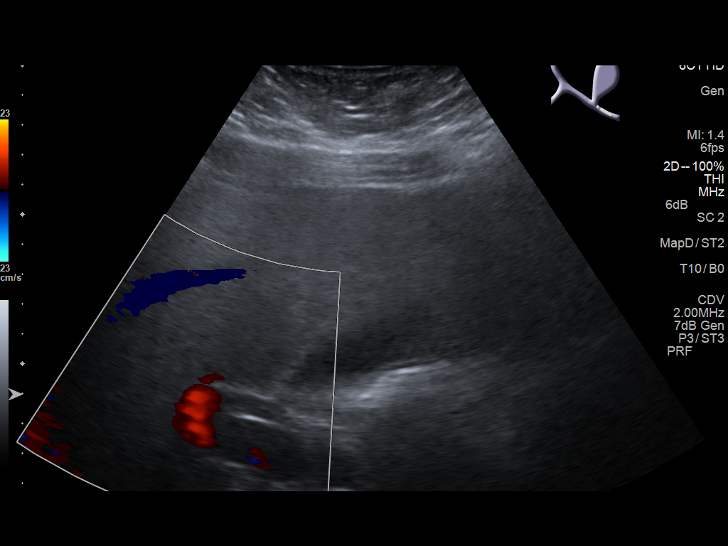
[im 19/56]
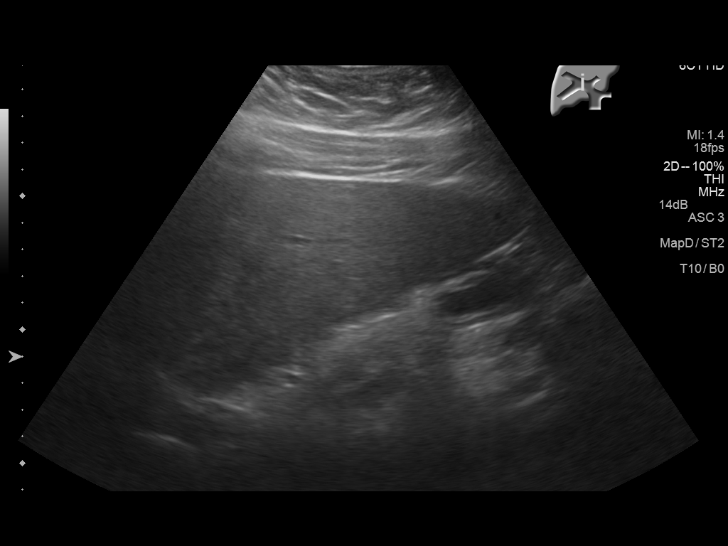
[im 21/56]
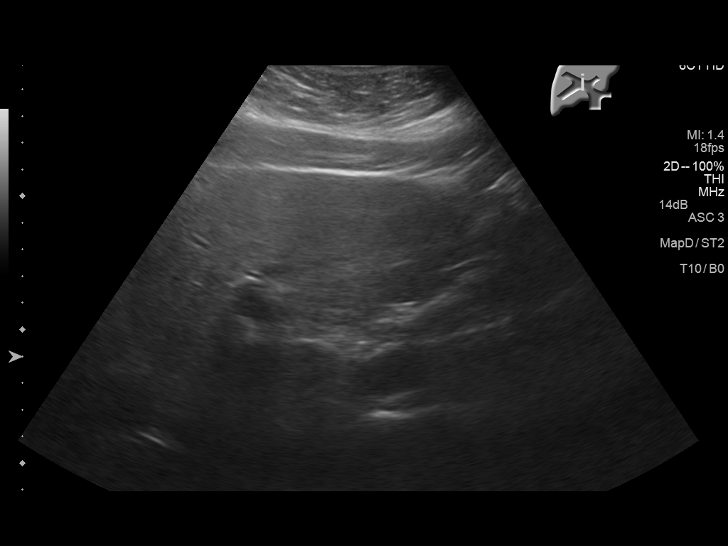
[im 26/56]
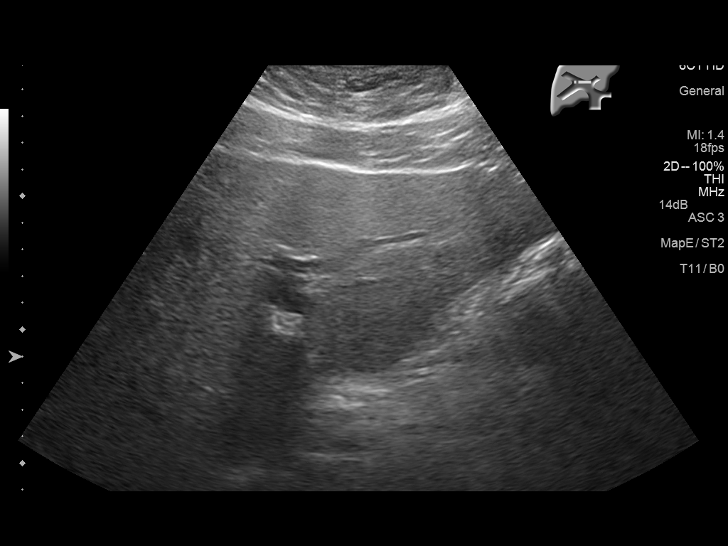
[im 30/56]
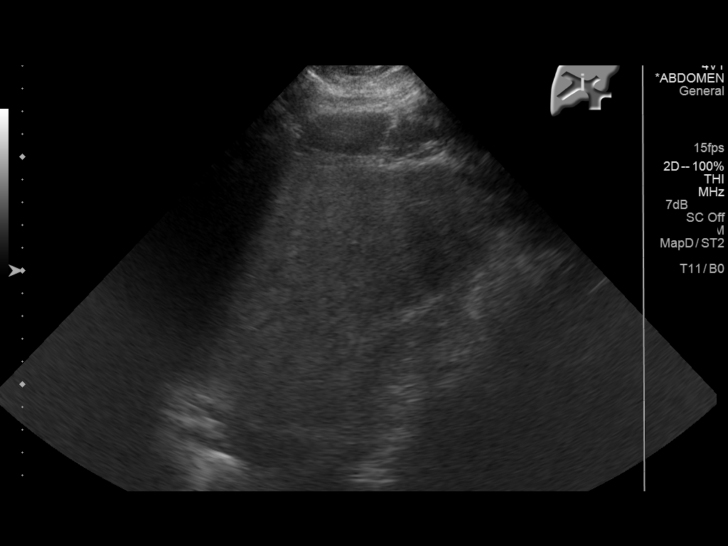
[im 35/56]
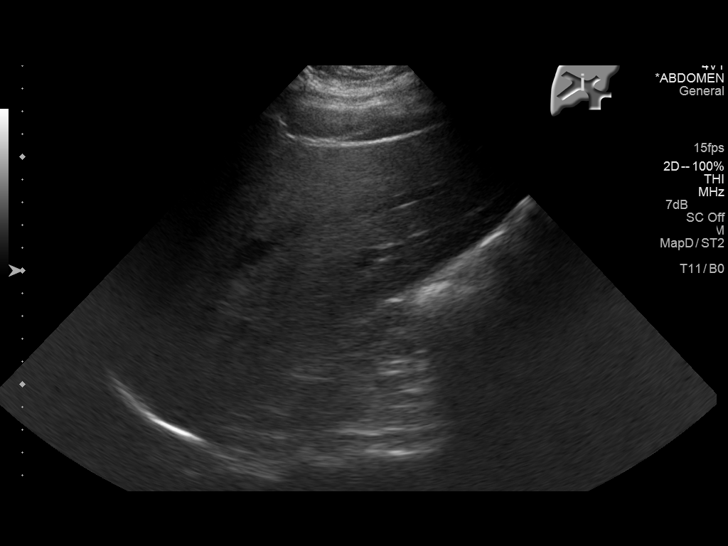
[im 37/56]
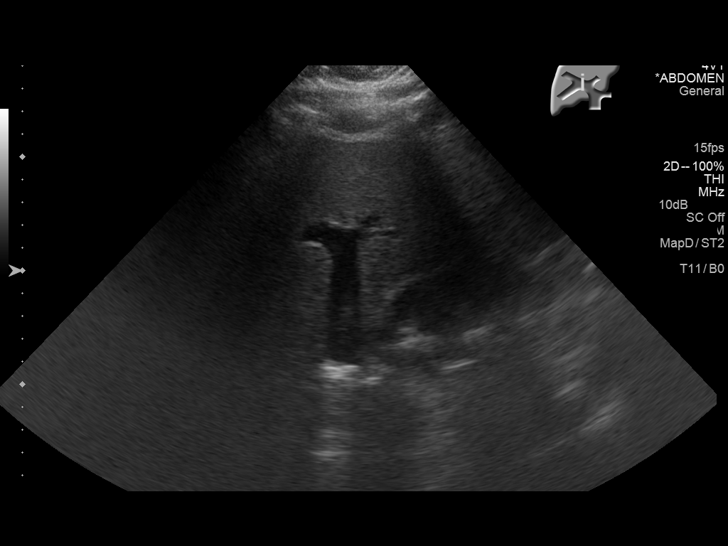
[im 42/56]
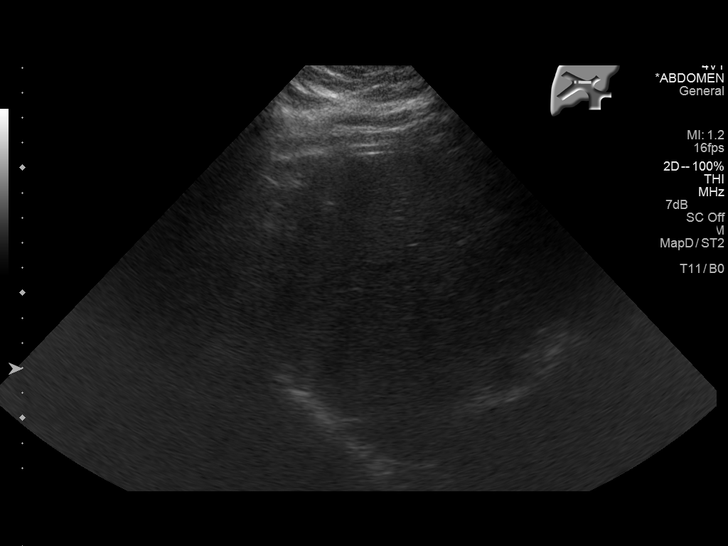
[im 46/56]
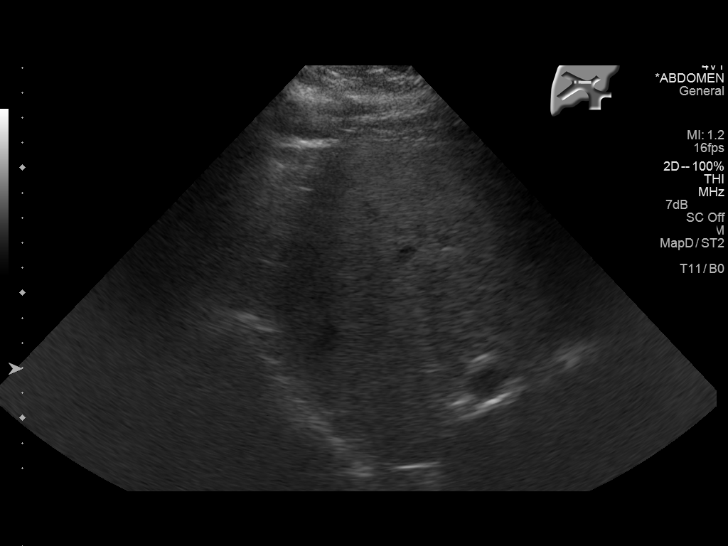
[im 51/56]
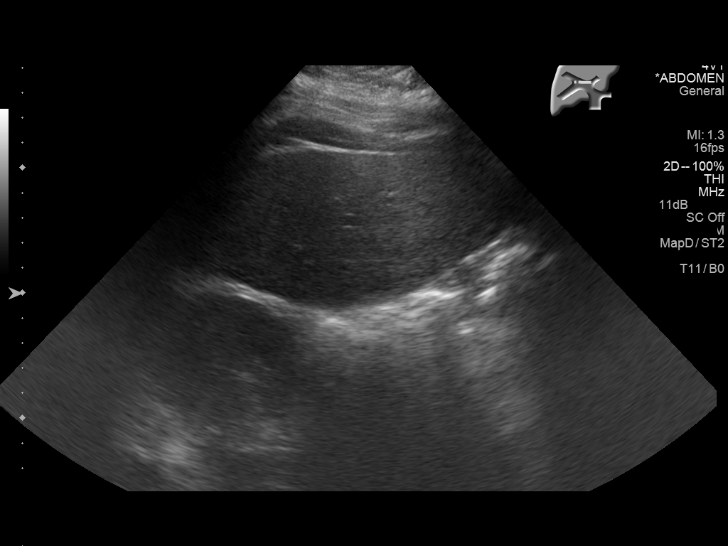
[im 56/56]
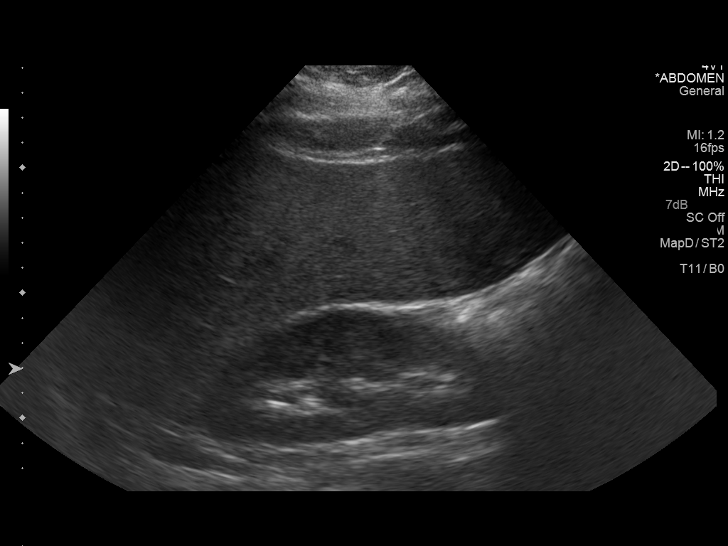

[14 of 25 positions shown; findings below may reference images not displayed]

FINDINGS: Gallbladder:

No gallstones or wall thickening visualized. No sonographic Murphy
sign noted by sonographer.

Common bile duct:

Diameter: 6.0 mm

Liver:

There is diffuse increased echogenicity of the liver and decreased
through transmission consistent with fatty infiltration. No focal
lesions or biliary dilatation.
IMPRESSION: Diffuse fatty infiltration of the liver.

No gallstones or findings for acute cholecystitis. Common bile duct
upper limits normal for age.
